# Patient Record
Sex: Male | Born: 1990 | Race: White | Hispanic: No | Marital: Single | Smoking: Current every day smoker
Health system: Southern US, Community
[De-identification: ages and names within clinical notes are randomized; demographics above are authoritative.]

---
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
## 2013-11-26 ENCOUNTER — Encounter (HOSPITAL_COMMUNITY): Payer: Self-pay | Admitting: Emergency Medicine

## 2013-11-26 ENCOUNTER — Emergency Department (HOSPITAL_COMMUNITY): Payer: BC Managed Care – PPO

## 2013-11-26 ENCOUNTER — Emergency Department (HOSPITAL_COMMUNITY)
Admission: EM | Admit: 2013-11-26 | Discharge: 2013-11-26 | Disposition: A | Discharge status: Home / Self Care | Payer: BC Managed Care – PPO | Attending: Emergency Medicine | Admitting: Emergency Medicine

## 2013-11-26 DIAGNOSIS — S0180XA Unspecified open wound of other part of head, initial encounter: Secondary | ICD-10-CM | POA: Insufficient documentation

## 2013-11-26 DIAGNOSIS — S0990XA Unspecified injury of head, initial encounter: Secondary | ICD-10-CM | POA: Insufficient documentation

## 2013-11-26 DIAGNOSIS — F17200 Nicotine dependence, unspecified, uncomplicated: Secondary | ICD-10-CM | POA: Insufficient documentation

## 2013-11-26 DIAGNOSIS — T07XXXA Unspecified multiple injuries, initial encounter: Secondary | ICD-10-CM

## 2013-11-26 DIAGNOSIS — Y92410 Unspecified street and highway as the place of occurrence of the external cause: Secondary | ICD-10-CM | POA: Insufficient documentation

## 2013-11-26 DIAGNOSIS — S01501A Unspecified open wound of lip, initial encounter: Secondary | ICD-10-CM | POA: Insufficient documentation

## 2013-11-26 DIAGNOSIS — S0181XA Laceration without foreign body of other part of head, initial encounter: Secondary | ICD-10-CM

## 2013-11-26 DIAGNOSIS — Y9389 Activity, other specified: Secondary | ICD-10-CM | POA: Insufficient documentation

## 2013-11-26 DIAGNOSIS — IMO0002 Reserved for concepts with insufficient information to code with codable children: Secondary | ICD-10-CM | POA: Insufficient documentation

## 2013-11-26 DIAGNOSIS — S025XXA Fracture of tooth (traumatic), initial encounter for closed fracture: Secondary | ICD-10-CM | POA: Insufficient documentation

## 2013-11-26 DIAGNOSIS — F1010 Alcohol abuse, uncomplicated: Secondary | ICD-10-CM | POA: Insufficient documentation

## 2013-11-26 DIAGNOSIS — Z23 Encounter for immunization: Secondary | ICD-10-CM | POA: Insufficient documentation

## 2013-11-26 DIAGNOSIS — F10929 Alcohol use, unspecified with intoxication, unspecified: Secondary | ICD-10-CM

## 2013-11-26 LAB — COMPREHENSIVE METABOLIC PANEL
ALBUMIN: 4.2 g/dL (ref 3.5–5.2)
ALK PHOS: 77 U/L (ref 39–117)
ALT: 23 U/L (ref 0–53)
AST: 28 U/L (ref 0–37)
BILIRUBIN TOTAL: 0.2 mg/dL — AB (ref 0.3–1.2)
BUN: 14 mg/dL (ref 6–23)
CHLORIDE: 107 meq/L (ref 96–112)
CO2: 24 meq/L (ref 19–32)
Calcium: 9 mg/dL (ref 8.4–10.5)
Creatinine, Ser: 0.89 mg/dL (ref 0.50–1.35)
GFR calc Af Amer: >90 mL/min (ref >90)
Glucose, Bld: 120 mg/dL — ABNORMAL HIGH (ref 70–99)
POTASSIUM: 4.2 meq/L (ref 3.7–5.3)
Sodium: 147 mEq/L (ref 137–147)
Total Protein: 7.7 g/dL (ref 6.0–8.3)

## 2013-11-26 LAB — CBC
HCT: 46 % (ref 39.0–52.0)
Hemoglobin: 16.5 g/dL (ref 13.0–17.0)
MCH: 31.7 pg (ref 26.0–34.0)
MCHC: 35.9 g/dL (ref 30.0–36.0)
MCV: 88.5 fL (ref 78.0–100.0)
PLATELETS: 206 10*3/uL (ref 150–400)
RBC: 5.2 MIL/uL (ref 4.22–5.81)
RDW: 12.5 % (ref 11.5–15.5)
WBC: 14.3 10*3/uL — ABNORMAL HIGH (ref 4.0–10.5)

## 2013-11-26 LAB — URINALYSIS, ROUTINE W REFLEX MICROSCOPIC
Bilirubin Urine: NEGATIVE
GLUCOSE, UA: NEGATIVE mg/dL
HGB URINE DIPSTICK: NEGATIVE
Ketones, ur: NEGATIVE mg/dL
Leukocytes, UA: NEGATIVE
Nitrite: NEGATIVE
PH: 5.5 (ref 5.0–8.0)
Protein, ur: NEGATIVE mg/dL
SPECIFIC GRAVITY, URINE: 1.012 (ref 1.005–1.030)
UROBILINOGEN UA: 0.2 mg/dL (ref 0.0–1.0)

## 2013-11-26 LAB — ETHANOL: Alcohol, Ethyl (B): 185 mg/dL — ABNORMAL HIGH (ref 0–11)

## 2013-11-26 LAB — CDS SEROLOGY

## 2013-11-26 LAB — PROTIME-INR
INR: 1.08 (ref 0.00–1.49)
PROTHROMBIN TIME: 13.8 s (ref 11.6–15.2)

## 2013-11-26 LAB — SAMPLE TO BLOOD BANK

## 2013-11-26 MED ORDER — TETANUS-DIPHTH-ACELL PERTUSSIS 5-2.5-18.5 LF-MCG/0.5 IM SUSP
0.5000 mL | Freq: Once | INTRAMUSCULAR | Status: AC
  Administered 2013-11-26: 0.5 mL via INTRAMUSCULAR
  Filled 2013-11-26: qty 0.5

## 2013-11-26 MED ORDER — HYDROCODONE-ACETAMINOPHEN 5-325 MG PO TABS: 1.0000 | ORAL_TABLET | ORAL | Status: AC | PRN

## 2013-11-26 MED ORDER — CLINDAMYCIN PHOSPHATE 600 MG/50ML IV SOLN
600.0000 mg | Freq: Once | INTRAVENOUS | Status: AC
  Administered 2013-11-26: 600 mg via INTRAVENOUS
  Filled 2013-11-26: qty 50

## 2013-11-26 MED ORDER — CEPHALEXIN 500 MG PO CAPS
500.0000 mg | ORAL_CAPSULE | Freq: Four times a day (QID) | ORAL | Status: AC
Start: 2013-11-26 — End: ?
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
## 2013-11-26 NOTE — ED Notes (Signed)
Patient transported to CT and xray 

## 2013-11-26 NOTE — ED Notes (Signed)
ENT at bedside to eval pt

## 2013-11-26 NOTE — ED Notes (Signed)
Returned from radiology, pt sleeping.

## 2013-11-26 NOTE — ED Notes (Signed)
Pt being sutured at beside by dr Donell Beersbyerly

## 2013-12-15 ENCOUNTER — Other Ambulatory Visit: Payer: Self-pay | Admitting: *Deleted

## 2013-12-15 ENCOUNTER — Ambulatory Visit (HOSPITAL_COMMUNITY)
Admission: RE | Admit: 2013-12-15 | Discharge: 2013-12-15 | Disposition: A | Discharge status: Home / Self Care | Payer: BC Managed Care – PPO | Source: Ambulatory Visit | Attending: Internal Medicine | Admitting: Internal Medicine

## 2013-12-15 DIAGNOSIS — M7989 Other specified soft tissue disorders: Secondary | ICD-10-CM

## 2013-12-15 NOTE — Progress Notes (Signed)
Right Lower Extremity Venous Duplex Completed. Negative for DVT and SVT. °Brianna L Mazza,RVT °
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
